# Patient Record
Sex: Female | Born: 1995 | Race: White | Hispanic: No | Marital: Single | Smoking: Never smoker
Health system: Southern US, Community
[De-identification: ages and names within clinical notes are randomized; demographics above are authoritative.]

## PROBLEM LIST (undated history)

## (undated) DIAGNOSIS — G43909 Migraine, unspecified, not intractable, without status migrainosus: Secondary | ICD-10-CM

## (undated) HISTORY — PX: CHOLECYSTECTOMY: SHX55

---
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
## 2015-12-12 ENCOUNTER — Emergency Department (HOSPITAL_COMMUNITY)
Admission: EM | Admit: 2015-12-12 | Discharge: 2015-12-12 | Disposition: A | Discharge status: Home / Self Care | Payer: Commercial Managed Care - HMO | Attending: Emergency Medicine | Admitting: Emergency Medicine

## 2015-12-12 ENCOUNTER — Encounter (HOSPITAL_COMMUNITY): Payer: Self-pay | Admitting: Emergency Medicine

## 2015-12-12 DIAGNOSIS — O209 Hemorrhage in early pregnancy, unspecified: Secondary | ICD-10-CM | POA: Insufficient documentation

## 2015-12-12 DIAGNOSIS — O9989 Other specified diseases and conditions complicating pregnancy, childbirth and the puerperium: Secondary | ICD-10-CM | POA: Diagnosis not present

## 2015-12-12 DIAGNOSIS — R000 Tachycardia, unspecified: Secondary | ICD-10-CM | POA: Insufficient documentation

## 2015-12-12 DIAGNOSIS — O99411 Diseases of the circulatory system complicating pregnancy, first trimester: Secondary | ICD-10-CM | POA: Insufficient documentation

## 2015-12-12 DIAGNOSIS — Z793 Long term (current) use of hormonal contraceptives: Secondary | ICD-10-CM | POA: Insufficient documentation

## 2015-12-12 DIAGNOSIS — Z79899 Other long term (current) drug therapy: Secondary | ICD-10-CM | POA: Insufficient documentation

## 2015-12-12 DIAGNOSIS — G43909 Migraine, unspecified, not intractable, without status migrainosus: Secondary | ICD-10-CM | POA: Diagnosis not present

## 2015-12-12 DIAGNOSIS — Z3A00 Weeks of gestation of pregnancy not specified: Secondary | ICD-10-CM | POA: Insufficient documentation

## 2015-12-12 DIAGNOSIS — Z7982 Long term (current) use of aspirin: Secondary | ICD-10-CM | POA: Diagnosis not present

## 2015-12-12 LAB — CBC WITH DIFFERENTIAL/PLATELET
Basophils Absolute: 0 10*3/uL (ref 0.0–0.1)
Basophils Relative: 0 %
EOS ABS: 0 10*3/uL (ref 0.0–0.7)
EOS PCT: 0 %
HCT: 37.5 % (ref 36.0–46.0)
Hemoglobin: 12.7 g/dL (ref 12.0–15.0)
LYMPHS ABS: 2.4 10*3/uL (ref 0.7–4.0)
Lymphocytes Relative: 20 %
MCH: 31.1 pg (ref 26.0–34.0)
MCHC: 33.9 g/dL (ref 30.0–36.0)
MCV: 91.9 fL (ref 78.0–100.0)
MONOS PCT: 4 %
Monocytes Absolute: 0.5 10*3/uL (ref 0.1–1.0)
Neutro Abs: 9 10*3/uL — ABNORMAL HIGH (ref 1.7–7.7)
Neutrophils Relative %: 76 %
PLATELETS: 301 10*3/uL (ref 150–400)
RBC: 4.08 MIL/uL (ref 3.87–5.11)
RDW: 12.5 % (ref 11.5–15.5)
WBC: 12 10*3/uL — AB (ref 4.0–10.5)

## 2015-12-12 LAB — WET PREP, GENITAL
SPERM: NONE SEEN
Trich, Wet Prep: NONE SEEN
Yeast Wet Prep HPF POC: NONE SEEN

## 2015-12-12 LAB — HCG, QUANTITATIVE, PREGNANCY: HCG, BETA CHAIN, QUANT, S: 151 m[IU]/mL — AB (ref <5)

## 2015-12-12 LAB — ABO/RH
ABO/RH(D): A NEG
Antibody Screen: NEGATIVE

## 2015-12-12 MED ORDER — RHO D IMMUNE GLOBULIN 1500 UNIT/2ML IJ SOSY
300.0000 ug | PREFILLED_SYRINGE | Freq: Once | INTRAMUSCULAR | Status: AC
  Administered 2015-12-12: 300 ug via INTRAMUSCULAR

## 2015-12-12 NOTE — Discharge Instructions (Signed)
[facility], in 2 days to repeat the pregnancy hormone level. If you have heavy bleeding, severe pain or other problems before then go immediately to [facility].   Vaginal Bleeding During Pregnancy, First Trimester A small amount of bleeding (spotting) from the vagina is common in early pregnancy. Sometimes the bleeding is normal and is not a problem, and sometimes it is a sign of something serious. Be sure to tell your doctor about any bleeding from your vagina right away. HOME CARE  Watch your condition for any changes.  Follow your doctor's instructions about how active you can be.  If you are on bed rest:  You may need to stay in bed and only get up to use the bathroom.  You may be allowed to do some activities.  If you need help, make plans for someone to help you.  Write down:  The number of pads you use each day.  How often you change pads.  How soaked (saturated) your pads are.  Do not use tampons.  Do not douche.  Do not have sex or orgasms until your doctor says it is okay.  If you pass any tissue from your vagina, save the tissue so you can show it to your doctor.  Only take medicines as told by your doctor.  Do not take aspirin because it can make you bleed.  Keep all follow-up visits as told by your doctor. GET HELP IF:   You bleed from your vagina.  You have cramps.  You have labor pains.  You have a fever that does not go away after you take medicine. GET HELP RIGHT AWAY IF:   You have very bad cramps in your back or belly (abdomen).  You pass large clots or tissue from your vagina.  You bleed more.  You feel light-headed or weak.  You pass out (faint).  You have chills.  You are leaking fluid or have a gush of fluid from your vagina.  You pass out while pooping (having a bowel movement). MAKE SURE YOU:  Understand these instructions.  Will watch your condition.  Will get help right away if you are not  doing well or get worse.   This information is not intended to replace advice given to you by your health care provider. Make sure you discuss any questions you have with your health care provider.   Document Released: 03/31/2014 Document Reviewed: 03/31/2014 Elsevier Interactive Patient Education [employer]2016 Elsevier Inc.

## 2015-12-12 NOTE — ED Provider Notes (Signed)
CSN: [id number]     Arrival date & time 12/12/15  1741 History   First MD Initiated Contact with Patient 12/12/15 1750     Chief Complaint  Patient presents with  . Vaginal Bleeding     (Consider location/radiation/quality/duration/timing/severity/associated sxs/prior Treatment) Patient is a 20 y.o. female presenting with vaginal bleeding. The history is provided by the patient. No language interpreter was used.  Vaginal Bleeding Quality:  Bright red Severity:  Moderate Onset quality:  Sudden Duration:  2 hours Timing:  Constant Chronicity:  New Menstrual history:  Regular and missed period Number of pads used:  0 Possible pregnancy: yes   Context: spontaneously   Relieved by:  None tried Worsened by:  Nothing tried Ineffective treatments:  None tried Associated symptoms: abdominal pain   Risk factors: unprotected sex    Roberta King is a 20 y.o. G1P0  presents to the ED with vaginal bleeding. She reports she found out she was pregnant 2 days ago and today she started bleeding. It started heavier than a period and then got less. LMP 10/24/15. She reports lower abdominal cramping. She has been with her current partner x 5 months. She denies hx of STI's.   Past Medical History  Diagnosis Date  . Migraines    History reviewed. No pertinent past surgical history. No family history on file. Social History  Substance Use Topics  . Smoking status: Never Smoker   . Smokeless tobacco: None  . Alcohol Use: No   OB History    Gravida Para Term Preterm AB TAB SAB Ectopic Multiple Living   1              Review of Systems  Gastrointestinal: Positive for abdominal pain.  Genitourinary: Positive for vaginal bleeding.  all other systems negative    Allergies  Sulfa antibiotics  Home Medications   Prior to Admission medications   Medication Sig Start Date End Date Taking? Authorizing Provider  aspirin-acetaminophen-caffeine (EXCEDRIN MIGRAINE) 201 106 2975 MG per tablet  Take 2 tablets by mouth every 6 (six) hours as needed. For migraine.    Historical Provider, MD  ibuprofen (ADVIL,MOTRIN) 200 MG tablet Take 400 mg by mouth every 6 (six) hours as needed. pain    Historical Provider, MD  metoCLOPramide (REGLAN) 10 MG tablet Take 0.5 tablets (5 mg total) by mouth every 6 (six) hours. 06/10/13   Blane Ohara, MD  norethindrone-ethinyl estradiol (TRIPHASIL,CYCLAFEM,ALYACEN) 0.5/0.75/1-35 MG-MCG tablet Take 1 tablet by mouth daily.    Historical Provider, MD  ondansetron (ZOFRAN) 4 MG tablet Take 1 tablet (4 mg total) by mouth every 6 (six) hours. 06/10/13   Blane Ohara, MD   BP 130/82 mmHg  Pulse 107  Temp(Src) 97.9 F (36.6 C) (Oral)  Resp 20  SpO2 99%  LMP 09/29/2015 (Approximate) Physical Exam  Constitutional: She is oriented to person, place, and time. She appears well-developed and well-nourished. No distress.  HENT:  Head: Normocephalic.  Eyes: Conjunctivae and EOM are normal.  Neck: Normal range of motion. Neck supple.  Cardiovascular: Tachycardia present.   Pulmonary/Chest: Effort normal.  Abdominal: Soft. There is no tenderness.  Genitourinary:  External genitalia without lesions, moderate blood vaginal vault. Cervix closed, no CMT, no adnexal tenderness, uterus without palpable enlargement.   Musculoskeletal: Normal range of motion.  Neurological: She is alert and oriented to person, place, and time. No cranial nerve deficit.  Skin: Skin is warm and dry.  Psychiatric: She has a normal mood and affect. Her behavior is normal.  Nursing  note and vitals reviewed.   ED Course  Procedures (including critical care time) Results for orders placed or performed during the hospital encounter of 12/12/15 (from the past 24 hour(s))  CBC with Differential/Platelet     Status: Abnormal   Collection Time: 12/12/15  6:15 PM  Result Value Ref Range   WBC 12.0 (H) 4.0 - 10.5 K/uL   RBC 4.08 3.87 - 5.11 MIL/uL   Hemoglobin 12.7 12.0 - 15.0 g/dL   HCT 16.137.5  09.636.0 - 04.546.0 %   MCV 91.9 78.0 - 100.0 fL   MCH 31.1 26.0 - 34.0 pg   MCHC 33.9 30.0 - 36.0 g/dL   RDW 40.912.5 81.111.5 - 91.415.5 %   Platelets 301 150 - 400 K/uL   Neutrophils Relative % 76 %   Neutro Abs 9.0 (H) 1.7 - 7.7 K/uL   Lymphocytes Relative 20 %   Lymphs Abs 2.4 0.7 - 4.0 K/uL   Monocytes Relative 4 %   Monocytes Absolute 0.5 0.1 - 1.0 K/uL   Eosinophils Relative 0 %   Eosinophils Absolute 0.0 0.0 - 0.7 K/uL   Basophils Relative 0 %   Basophils Absolute 0.0 0.0 - 0.1 K/uL  hCG, quantitative, pregnancy     Status: Abnormal   Collection Time: 12/12/15  6:15 PM  Result Value Ref Range   hCG, Beta Chain, Quant, S 151 (H) <5 mIU/mL  ABO/Rh     Status: None   Collection Time: 12/12/15  6:15 PM  Result Value Ref Range   ABO/RH(D) A NEG    Antibody Screen NEG   Wet prep, genital     Status: Abnormal   Collection Time: 12/12/15  6:29 PM  Result Value Ref Range   Yeast Wet Prep HPF POC NONE SEEN NONE SEEN   Trich, Wet Prep NONE SEEN NONE SEEN   Clue Cells Wet Prep HPF POC PRESENT (A) NONE SEEN   WBC, Wet Prep HPF POC MANY (A) NONE SEEN   Sperm NONE SEEN      Imaging Review No results found. I have personally reviewed and evaluated these lab results as part of my medical decision-making.   MDM  20 y.o. female with vaginal bleeding in early pregnancy. I discussed this case with Dr. Debroah LoopArnold, on call GYN at [facility] and he request the patient to follow up in 48 hours. Patient is Rh negative and Rhogam given prior to d/c. Discussed with the patient and all questioned fully answered. [facility] or sooner for problems.  Cultures for GC and Chlamydia pending.    Final diagnoses:  Vaginal bleeding in pregnancy, first trimester       [name], NP 12/13/15 2337  Linwood DibblesJon Knapp, MD 12/15/15 [phone]

## 2015-12-12 NOTE — ED Notes (Signed)
Pt ambulates independently at time of discharge. Discharge instructions and follow up information reviewed with patient and family. No additional questions or concerns at this time.

## 2015-12-12 NOTE — ED Notes (Signed)
Pt reports that she found out she was pregnant a couple days ago. Pt reports vaginal bleeding and cramping starting prior to arrival. Pt alert x4. NAD at this time.

## 2015-12-13 LAB — HIV ANTIBODY (ROUTINE TESTING W REFLEX): HIV Screen 4th Generation wRfx: NONREACTIVE

## 2015-12-13 LAB — RH IG WORKUP (INCLUDES ABO/RH)
ABO/RH(D): A NEG
ANTIBODY SCREEN: NEGATIVE
GESTATIONAL AGE(WKS): 3
Unit division: 0

## 2015-12-13 LAB — RPR: RPR Ser Ql: NONREACTIVE

## 2015-12-14 ENCOUNTER — Other Ambulatory Visit (INDEPENDENT_AMBULATORY_CARE_PROVIDER_SITE_OTHER): Payer: Commercial Managed Care - HMO | Admitting: Obstetrics and Gynecology

## 2015-12-14 DIAGNOSIS — O3680X0 Pregnancy with inconclusive fetal viability, not applicable or unspecified: Secondary | ICD-10-CM | POA: Diagnosis not present

## 2015-12-14 LAB — GC/CHLAMYDIA PROBE AMP (~~LOC~~) NOT AT ARMC
Chlamydia: NEGATIVE
Neisseria Gonorrhea: NEGATIVE

## 2015-12-14 LAB — HCG, QUANTITATIVE, PREGNANCY: hCG, Beta Chain, Quant, S: 42 m[IU]/mL — ABNORMAL HIGH (ref <5)

## 2015-12-14 NOTE — Progress Notes (Signed)
S: 20 yo G1 @ 7+2 by sure lmp here for lab draw. 2 days ago ED visit for vaginal bleeding. Had cramping that day. Today no cramps today. Had migraines with ocp in past.   O:  Abdomen: non-tender, no rebound our guarding.  A/P: SAB vs resolving ectopic. Return one week for repeat hcg, will trend to less than 5. Ectopic and infection return precautions discussed. L[facility] information provided.

## 2015-12-14 NOTE — Progress Notes (Signed)
Here for stat bhcg. Reports having pelvic pain yesterday =5 with heavy bleeding. Denies any pain or bleeding today.

## 2017-11-16 ENCOUNTER — Other Ambulatory Visit: Payer: Self-pay

## 2017-11-16 ENCOUNTER — Emergency Department (HOSPITAL_BASED_OUTPATIENT_CLINIC_OR_DEPARTMENT_OTHER)
Admission: EM | Admit: 2017-11-16 | Discharge: 2017-11-16 | Disposition: A | Discharge status: Home / Self Care | Payer: No Typology Code available for payment source | Attending: Emergency Medicine | Admitting: Emergency Medicine

## 2017-11-16 ENCOUNTER — Encounter (HOSPITAL_BASED_OUTPATIENT_CLINIC_OR_DEPARTMENT_OTHER): Payer: Self-pay

## 2017-11-16 ENCOUNTER — Emergency Department (HOSPITAL_BASED_OUTPATIENT_CLINIC_OR_DEPARTMENT_OTHER): Payer: No Typology Code available for payment source

## 2017-11-16 DIAGNOSIS — Z79899 Other long term (current) drug therapy: Secondary | ICD-10-CM | POA: Insufficient documentation

## 2017-11-16 DIAGNOSIS — S8002XA Contusion of left knee, initial encounter: Secondary | ICD-10-CM

## 2017-11-16 DIAGNOSIS — Z7901 Long term (current) use of anticoagulants: Secondary | ICD-10-CM | POA: Diagnosis not present

## 2017-11-16 DIAGNOSIS — I10 Essential (primary) hypertension: Secondary | ICD-10-CM | POA: Insufficient documentation

## 2017-11-16 DIAGNOSIS — S8001XA Contusion of right knee, initial encounter: Secondary | ICD-10-CM

## 2017-11-16 DIAGNOSIS — B349 Viral infection, unspecified: Secondary | ICD-10-CM | POA: Diagnosis not present

## 2017-11-16 DIAGNOSIS — R05 Cough: Secondary | ICD-10-CM | POA: Diagnosis present

## 2017-11-16 MED ORDER — IBUPROFEN 600 MG PO TABS: 600.0000 mg | ORAL_TABLET | Freq: Four times a day (QID) | ORAL | 0 refills | Status: AC | PRN
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
## 2017-11-16 NOTE — ED Notes (Signed)
ED Provider at bedside. 
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
## 2021-09-23 ENCOUNTER — Emergency Department
Admission: RE | Admit: 2021-09-23 | Discharge: 2021-09-23 | Disposition: A | Discharge status: Home / Self Care | Payer: BLUE CROSS/BLUE SHIELD | Source: Ambulatory Visit

## 2021-09-23 ENCOUNTER — Other Ambulatory Visit: Payer: Self-pay

## 2021-09-23 VITALS — BP 102/71 | HR 82 | Temp 98.3°F | Resp 20 | Ht 63.0 in | Wt 120.0 lb

## 2021-09-23 DIAGNOSIS — J029 Acute pharyngitis, unspecified: Secondary | ICD-10-CM

## 2021-09-23 LAB — POCT RAPID STREP A (OFFICE): Rapid Strep A Screen: NEGATIVE

## 2021-09-23 NOTE — ED Provider Notes (Signed)
[facility]    CSN: [id number] Arrival date & time: 09/23/21  1844      History   Chief Complaint Chief Complaint  Patient presents with   Sore Throat    HPI Roberta King is a 25 y.o. female.   HPI 25 year old female presents with sore throat for 1 day.  Past Medical History:  Diagnosis Date   Migraines     There are no problems to display for this patient.   Past Surgical History:  Procedure Laterality Date   CHOLECYSTECTOMY      OB History     Gravida  1   Para      Term      Preterm      AB      Living         SAB      IAB      Ectopic      Multiple      Live Births               Home Medications    Prior to Admission medications   Medication Sig Start Date End Date Taking? Authorizing Provider  ibuprofen (ADVIL,MOTRIN) 600 MG tablet Take 1 tablet (600 mg total) by mouth every 6 (six) hours as needed. 11/16/17   Renne Crigler, PA-C    Family History Family History  Problem Relation Age of Onset   Healthy Mother    Cancer Father     Social History Social History   Tobacco Use   Smoking status: Never   Smokeless tobacco: Never  Vaping Use   Vaping Use: Never used  Substance Use Topics   Alcohol use: Yes    Comment: rare   Drug use: No     Allergies   Sulfa antibiotics   Review of Systems Review of Systems  HENT:  Positive for sore throat.   All other systems reviewed and are negative.   Physical Exam Triage Vital Signs ED Triage Vitals  Enc Vitals Group     BP 09/23/21 1921 102/71     Pulse Rate 09/23/21 1921 82     Resp 09/23/21 1921 20     Temp 09/23/21 1921 98.3 F (36.8 C)     Temp Source 09/23/21 1921 Oral     SpO2 09/23/21 1921 99 %     Weight 09/23/21 1917 120 lb (54.4 kg)     Height 09/23/21 1917 5\' 3"  (1.6 m)     Head Circumference --      Peak Flow --      Pain Score 09/23/21 1916 4     Pain Loc --      Pain Edu? --      Excl. in GC? --    No data found.  Updated  Vital Signs BP 102/71 (BP Location: Right Arm)   Pulse 82   Temp 98.3 F (36.8 C) (Oral)   Resp 20   Ht 5\' 3"  (1.6 m)   Wt 120 lb (54.4 kg)   LMP 08/24/2021 (Approximate)   SpO2 99%   BMI 21.26 kg/m     Physical Exam Vitals and nursing note reviewed.  Constitutional:      General: She is not in acute distress.    Appearance: She is normal weight. She is not ill-appearing.  HENT:     Head: Normocephalic and atraumatic.     Right Ear: Tympanic membrane and ear canal normal.     Left Ear: Tympanic membrane  and ear canal normal.     Mouth/Throat:     Mouth: Mucous membranes are moist.     Pharynx: Oropharynx is clear. Uvula midline.  Eyes:     Conjunctiva/sclera: Conjunctivae normal.     Pupils: Pupils are equal, round, and reactive to light.  Cardiovascular:     Rate and Rhythm: Normal rate and regular rhythm.     Heart sounds: Normal heart sounds.  Pulmonary:     Effort: Pulmonary effort is normal.     Breath sounds: Normal breath sounds.  Skin:    General: Skin is warm and dry.  Neurological:     General: No focal deficit present.     Mental Status: She is alert and oriented to person, place, and time.     UC Treatments / Results  Labs (all labs ordered are listed, but only abnormal results are displayed) Labs Reviewed  CULTURE, GROUP A STREP  POCT RAPID STREP A (OFFICE)    EKG   Radiology No results found.  Procedures Procedures (including critical care time)  Medications Ordered in UC Medications - No data to display  Initial Impression / Assessment and Plan / UC Course  I have reviewed the triage vital signs and the nursing notes.  Pertinent labs & imaging results that were available during my care of the patient were reviewed by me and considered in my medical decision making (see chart for details).     MDM: 1.  Acute pharyngitis, unspecified etiology-rapid strep negative, throat culture ordered. Advised patient may take OTC Ibuprofen 400-600  mg 1-2 times daily, as needed for sore throat pain.  Advised we will follow-up with throat culture results once received.  Patient discharged home, hemodynamically stable. Final Clinical Impressions(s) / UC Diagnoses   Final diagnoses:  Acute pharyngitis, unspecified etiology     Discharge Instructions      Advised patient may take OTC Ibuprofen 400-600 mg 1-2 times daily, as needed for sore throat pain.  Advised we will follow-up with throat culture results once received.     ED Prescriptions   None    PDMP not reviewed this encounter.   Trevor Iha, FNP 09/23/21 1954

## 2021-09-23 NOTE — Discharge Instructions (Addendum)
Advised patient may take OTC Ibuprofen 400-600 mg 1-2 times daily, as needed for sore throat pain.  Advised we will follow-up with throat culture results once received.

## 2021-09-23 NOTE — ED Triage Notes (Signed)
Pt presents to Urgent Care with c/o sore throat x 1 day. Has not done COVID test.

## 2021-09-27 LAB — CULTURE, GROUP A STREP: Strep A Culture: NEGATIVE
